# Patient Record
Sex: Female | Born: 1980 | Race: White | Hispanic: No | Marital: Single | Smoking: Current every day smoker
Health system: Southern US, Community
[De-identification: ages and names within clinical notes are randomized; demographics above are authoritative.]

## PROBLEM LIST (undated history)

## (undated) DIAGNOSIS — J45909 Unspecified asthma, uncomplicated: Secondary | ICD-10-CM

## (undated) DIAGNOSIS — Z889 Allergy status to unspecified drugs, medicaments and biological substances status: Secondary | ICD-10-CM

## (undated) HISTORY — DX: Allergy status to unspecified drugs, medicaments and biological substances: Z88.9

## (undated) HISTORY — DX: Unspecified asthma, uncomplicated: J45.909

---

## 1998-06-01 ENCOUNTER — Emergency Department (HOSPITAL_COMMUNITY): Admission: EM | Admit: 1998-06-01 | Discharge: 1998-06-01 | Payer: Self-pay | Admitting: Emergency Medicine

## 1999-01-04 ENCOUNTER — Encounter: Payer: Self-pay | Admitting: Emergency Medicine

## 1999-01-04 ENCOUNTER — Emergency Department (HOSPITAL_COMMUNITY): Admission: EM | Admit: 1999-01-04 | Discharge: 1999-01-04 | Payer: Self-pay | Admitting: Emergency Medicine

## 2008-07-22 ENCOUNTER — Ambulatory Visit: Payer: Self-pay | Admitting: Family Medicine

## 2008-10-23 ENCOUNTER — Ambulatory Visit: Payer: Self-pay | Admitting: Family Medicine

## 2010-06-18 ENCOUNTER — Ambulatory Visit: Payer: Self-pay | Admitting: Family Medicine

## 2011-06-22 ENCOUNTER — Emergency Department (HOSPITAL_COMMUNITY)
Admission: EM | Admit: 2011-06-22 | Discharge: 2011-06-23 | Disposition: A | Discharge status: Home / Self Care | Payer: BC Managed Care – PPO | Attending: Emergency Medicine | Admitting: Emergency Medicine

## 2011-06-22 DIAGNOSIS — J45909 Unspecified asthma, uncomplicated: Secondary | ICD-10-CM | POA: Insufficient documentation

## 2011-06-22 DIAGNOSIS — R059 Cough, unspecified: Secondary | ICD-10-CM | POA: Insufficient documentation

## 2011-06-22 DIAGNOSIS — R05 Cough: Secondary | ICD-10-CM | POA: Insufficient documentation

## 2011-06-23 ENCOUNTER — Emergency Department (HOSPITAL_COMMUNITY): Payer: BC Managed Care – PPO

## 2011-07-30 ENCOUNTER — Emergency Department (HOSPITAL_COMMUNITY)
Admission: EM | Admit: 2011-07-30 | Discharge: 2011-07-30 | Disposition: A | Discharge status: Home / Self Care | Payer: BC Managed Care – PPO | Attending: Emergency Medicine | Admitting: Emergency Medicine

## 2011-07-30 DIAGNOSIS — K029 Dental caries, unspecified: Secondary | ICD-10-CM | POA: Insufficient documentation

## 2011-07-30 DIAGNOSIS — K089 Disorder of teeth and supporting structures, unspecified: Secondary | ICD-10-CM | POA: Insufficient documentation

## 2012-04-19 ENCOUNTER — Encounter (HOSPITAL_COMMUNITY): Payer: Self-pay | Admitting: Emergency Medicine

## 2012-04-19 ENCOUNTER — Emergency Department (HOSPITAL_COMMUNITY)
Admission: EM | Admit: 2012-04-19 | Discharge: 2012-04-19 | Disposition: A | Discharge status: Home / Self Care | Payer: BC Managed Care – PPO | Attending: Emergency Medicine | Admitting: Emergency Medicine

## 2012-04-19 ENCOUNTER — Emergency Department (HOSPITAL_COMMUNITY): Payer: BC Managed Care – PPO

## 2012-04-19 DIAGNOSIS — S60211A Contusion of right wrist, initial encounter: Secondary | ICD-10-CM

## 2012-04-19 DIAGNOSIS — F17200 Nicotine dependence, unspecified, uncomplicated: Secondary | ICD-10-CM | POA: Insufficient documentation

## 2012-04-19 DIAGNOSIS — W2209XA Striking against other stationary object, initial encounter: Secondary | ICD-10-CM | POA: Insufficient documentation

## 2012-04-19 DIAGNOSIS — S60219A Contusion of unspecified wrist, initial encounter: Secondary | ICD-10-CM | POA: Insufficient documentation

## 2012-04-19 DIAGNOSIS — Y9269 Other specified industrial and construction area as the place of occurrence of the external cause: Secondary | ICD-10-CM | POA: Insufficient documentation

## 2012-04-19 MED ORDER — HYDROCODONE-ACETAMINOPHEN 5-325 MG PO TABS
1.0000 | ORAL_TABLET | Freq: Once | ORAL | Status: AC
  Administered 2012-04-19: 1 via ORAL
  Filled 2012-04-19: qty 1

## 2012-04-19 MED ORDER — HYDROCODONE-ACETAMINOPHEN 5-325 MG PO TABS: 1.0000 | ORAL_TABLET | ORAL | Status: AC | PRN

## 2012-04-19 NOTE — ED Notes (Signed)
Patient states that she was loading a bin at work when her right inner wrist hit  A hook and hurt her wrist. Small puncture wound to her inner wrist with pain radiating to her thumb and elbow

## 2012-04-19 NOTE — Discharge Instructions (Signed)
You were see and evaluated today for your wrist injury. Your x-rays do show any concerning findings or broken bones. You have been given a brace to help rest your wrist. Use rest, ice, compression and elevation to reduce pain and swelling. Please followup with an orthopedic hand specialist or primary care provider for continued evaluation and treatment.   Contusion A contusion is a deep bruise. Contusions are the result of an injury that caused bleeding under the skin. The contusion may turn blue, purple, or yellow. Minor injuries will give you a painless contusion, but more severe contusions may stay painful and swollen for a few weeks.  CAUSES  A contusion is usually caused by a blow, trauma, or direct force to an area of the body. SYMPTOMS   Swelling and redness of the injured area.   Bruising of the injured area.   Tenderness and soreness of the injured area.   Pain.  DIAGNOSIS  The diagnosis can be made by taking a history and physical exam. An X-ray, CT scan, or MRI may be needed to determine if there were any associated injuries, such as fractures. TREATMENT  Specific treatment will depend on what area of the body was injured. In general, the best treatment for a contusion is resting, icing, elevating, and applying cold compresses to the injured area. Over-the-counter medicines may also be recommended for pain control. Ask your caregiver what the best treatment is for your contusion. HOME CARE INSTRUCTIONS   Put ice on the injured area.   Put ice in a plastic bag.   Place a towel between your skin and the bag.   Leave the ice on for 15 to 20 minutes, 3 to 4 times a day.   Only take over-the-counter or prescription medicines for pain, discomfort, or fever as directed by your caregiver. Your caregiver may recommend avoiding anti-inflammatory medicines (aspirin, ibuprofen, and naproxen) for 48 hours because these medicines may increase bruising.   Rest the injured area.   If  possible, elevate the injured area to reduce swelling.  SEEK IMMEDIATE MEDICAL CARE IF:   You have increased bruising or swelling.   You have pain that is getting worse.   Your swelling or pain is not relieved with medicines.  MAKE SURE YOU:   Understand these instructions.   Will watch your condition.   Will get help right away if you are not doing well or get worse.  Document Released: 07/20/2005 Document Revised: 09/29/2011 Document Reviewed: 08/15/2011 [facility] Patient Information 2012 [place], [place].    RESOURCE GUIDE  Chronic Pain Problems: Contact [facility]  [phone] Patients need to be referred by their primary care doctor.  Insufficient Money for Medicine: Contact United Way:  call "211" or [facility] [phone].  No Primary Care Doctor: - Call Health Connect  [phone] - can help you locate a primary care doctor that  accepts your insurance, provides certain services, etc. - Physician Referral [phone]  Agencies that provide inexpensive medical care: - [facility]  [phone] - [facility]  [phone] - [facility]  [phone] [facility]  [phone] - Planned Parenthood  [phone] [facility]  [phone]  Medicaid-accepting [facility] Providers: - [facility]- [address]  [phone], Mon-Fri 9am-7pm, Sat 9am-1pm - [facility]- [address] [place], [address] [place]  [phone] - [facility]- [address]  [phone] [facility]- [address]  [phone] - Renaye Rakers- [address] [place], [address], [phone]  Only accepts [place] patients after they have their name  applied to their card  Self Pay (no insurance) in [facility]: - Sickle Cell Patients: Dr Willey Blade, [facility]  [address] [place],  [phone] - [facility]- [address] [place]  [phone]       [facility]- [address]       -     [facility]- see information above (Speak to [employer] if you do not have insurance)       -  [facility]- [address] [place], [phone]       -  [facility]- [address],  [phone]       -  [facility]- [address] [phone]       -  Dr Julio Sicks-  [address], [place], [phone]       -  [facility]- [address] [phone]       -  [facility]- [address], [phone], also [address], [phone]       -    [facility]- [address] [place], [phone], 1st & 3rd Saturday   every month, 10am-1pm  1) Find a Doctor and Pay Out of Pocket Although you won't have to find out who is covered by your insurance plan, it is a good idea to ask around and get recommendations. You will then need to call the office and see if the doctor you have chosen will accept you as a new patient and what types of options they offer for patients who are self-pay. Some doctors offer discounts or will set up payment plans for their patients who do not have insurance, but you will need to ask so you aren't surprised when you get to your appointment.  2) Contact Your Local Health Department Not all health departments have doctors that can see patients for sick visits, but many do, so it is worth a call to see if yours does. If you don't know where your local health department is, you can check in your phone book. The CDC also has a tool to help you locate your state's health department, and many state websites also have listings of all of their local health departments.  3) Find a Walk-in Clinic If your illness is not likely to be very severe or complicated, you may want to try a walk in clinic. These are popping up all over the country in pharmacies,  drugstores, and shopping centers. They're usually staffed by nurse practitioners or physician assistants that have been trained to treat common illnesses and complaints. They're usually fairly quick and inexpensive. However, if you have serious medical issues or chronic medical problems, these are probably not your best option  STD Testing - [facility] of [facility] [place], STD Clinic, [address], [place], phone [phone] or [phone].  Monday - Friday, call for an appointment. [facility] Department of [employer], STD Clinic, [address], [place], phone [phone] or [phone].  Monday - Friday, call for an appointment.  Abuse/Neglect: [facility] Child Abuse Hotline (959)372-3084 [facility] Child Abuse  Hotline 906 567 2747 (After Hours)  Emergency Shelter:  [facility] (503)803-4468  Maternity Homes: - [facility] (513)051-7307 - [facility] [phone]  MRSA Hotline #:   (954) 405-0861  [facility] Resources  [facility]  United Way [facility] Dept. [address]                 [address]         [address] [place] [address]  Blondell Reveal Phone:  [phone]                                  Phone:  [phone]                   Phone:  [phone]  [facility], [phone] - [facility] - [phone]       -     [facility] in [place], [address],                                  (719)055-7411, [facility] Child Abuse Hotline (401)270-7245 or 209 778 2828 (After Hours)   Behavioral Health Services  Substance Abuse Resources: - Alcohol and Drug Services  [phone] - [facility]  (513)874-2960 - [facility] [phone] Floydene Flock [phone] - Residential & Outpatient Substance Abuse Program  [phone]  Psychological Services: [facility]  [phone] Services  [phone] - [facility], [phone] [place]. [address], [place], ACCESS LINE: (432)[phone] or [phone], [employer]an.[email or web address]  Dental Assistance  If unable to pay or uninsured, contact:  [facility] or [facility] to become qualified for the adult dental clinic.  Patients with Medicaid: [facility] [phone] [address], [phone] [address], [phone]  If unable to pay, or uninsured, contact [facility] (657)648-5446) or [facility] Department [phone] in [place], [phone] in [facility]) to become qualified for the adult dental clinic  Other Low-Cost Community Dental Services: - [facility]- [address] [place], [place], [place], [address], [phone], [address]. [phone], 2nd and 4th Thursday of the month at 6:30am.  10 clients each day by appointment, can sometimes see walk-in patients if someone does not show for an appointment. [facility]- [address] Ether Griffins [place], [place], [address], [phone] - [facility]- [address], [place], [place], [address], [phone] - [facility]- [phone] [facility]- [phone] [facility] Department- [phone]

## 2012-04-19 NOTE — ED Provider Notes (Signed)
Medical screening examination/treatment/procedure(s) were performed by non-physician practitioner and as supervising physician I was immediately available for consultation/collaboration.   Desjuan Stearns L Genesis Novosad, MD 04/19/12 2250 

## 2012-04-19 NOTE — ED Provider Notes (Signed)
History     CSN: [id number]  Arrival date & time 04/19/12  0223   First MD Initiated Contact with Patient 04/19/12 [phone]      Chief Complaint  Patient presents with  . Wrist Injury   HPI  History provided by the patient. Patient is a 31 year old female with no significant past medical history who presents with complaints of right wrist pain and injury. 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 She complains of pain and swelling to the area. Pain is worse with movements especially flexion. Patient also has some associated tingling and numbness to the hand and fingers greatest over the right thumb. She denies any weakness. Patient did not do anything to treat her symptoms. She denies any other aggravating or alleviating factors. She denies any other associated symptoms.    History reviewed. No pertinent past medical history.  History reviewed. No pertinent past surgical history.  History reviewed. No pertinent family history.  History  Substance Use Topics  . Smoking status: Current Everyday Smoker -- 1.0 packs/day    Types: Cigarettes  . Smokeless tobacco: Not on file  . Alcohol Use: No    OB History    Grav Para Term Preterm Abortions TAB SAB Ect Mult Living                  Review of Systems  Musculoskeletal: Positive for joint swelling.       Wrist pain  Neurological: Positive for numbness. Negative for weakness.    Allergies  Review of patient's allergies indicates no known allergies.  Home Medications  No current outpatient prescriptions on file.  BP 107/64  Pulse 76  Temp 98 F (36.7 C) (Oral)  Resp 18  Ht 5\' 4"  (1.626 m)  Wt 132 lb (59.875 kg)  BMI 22.66 kg/m2  SpO2 99%  LMP 04/19/2012  Physical Exam  Nursing note and vitals reviewed. Constitutional: She is oriented to person, place, and time. She appears well-developed and  well-nourished. No distress.  HENT:  Head: Normocephalic.  Cardiovascular: Normal rate and regular rhythm.   Pulmonary/Chest: Effort normal and breath sounds normal.  Musculoskeletal:       Small 2 cm area of localized erythema to medial anterior right wrist with small abrasion. There is mild associated swelling. Tenderness to palpation over the area. Pain with flexion of wrists and digits and hand. Normal strength in fingers with flexion and extension. Normal distal sensations and cap refill less than 2 seconds. Normal radial pulse at wrist. No significant pains over the bony structures.  Neurological: She is alert and oriented to person, place, and time.  Skin: Skin is warm and dry.  Psychiatric: She has a normal mood and affect. Her behavior is normal.    ED Course  Procedures   Dg Wrist Complete Right  04/19/2012  *RADIOLOGY REPORT*  Clinical Data: Wrist injury.  Difficulty grasping things.  RIGHT WRIST - COMPLETE 3+ VIEW  Comparison: None.  Findings: Slight dorsal orientation of the distal ulna which is likely positional.  Right wrist otherwise appears intact.  No evidence of acute fracture or subluxation.  No focal bone lesion or bone destruction.  Bone cortex and trabecular architecture appear intact.  No radiopaque soft tissue foreign bodies.  IMPRESSION: No acute bony abnormalities.  Original Report Authenticated By: Marlon Pel, M.D.     1. Contusion of wrist,  right       MDM  Patient seen and evaluated. Patient no acute distress.        Angus Seller, [place] 04/19/12 [phone]

## 2012-04-19 NOTE — ED Notes (Signed)
Pt states she is not wanting to report injury as work related.

## 2012-09-15 ENCOUNTER — Emergency Department (HOSPITAL_COMMUNITY)
Admission: EM | Admit: 2012-09-15 | Discharge: 2012-09-15 | Disposition: A | Discharge status: Home / Self Care | Payer: BC Managed Care – PPO | Attending: Emergency Medicine | Admitting: Emergency Medicine

## 2012-09-15 ENCOUNTER — Encounter (HOSPITAL_COMMUNITY): Payer: Self-pay | Admitting: *Deleted

## 2012-09-15 DIAGNOSIS — R221 Localized swelling, mass and lump, neck: Secondary | ICD-10-CM | POA: Insufficient documentation

## 2012-09-15 DIAGNOSIS — K0889 Other specified disorders of teeth and supporting structures: Secondary | ICD-10-CM

## 2012-09-15 DIAGNOSIS — R220 Localized swelling, mass and lump, head: Secondary | ICD-10-CM | POA: Insufficient documentation

## 2012-09-15 DIAGNOSIS — K089 Disorder of teeth and supporting structures, unspecified: Secondary | ICD-10-CM | POA: Insufficient documentation

## 2012-09-15 DIAGNOSIS — F17200 Nicotine dependence, unspecified, uncomplicated: Secondary | ICD-10-CM | POA: Insufficient documentation

## 2012-09-15 MED ORDER — PENICILLIN V POTASSIUM 500 MG PO TABS
500.0000 mg | ORAL_TABLET | Freq: Once | ORAL | Status: AC
  Administered 2012-09-15: 500 mg via ORAL
  Filled 2012-09-15: qty 1

## 2012-09-15 MED ORDER — HYDROCODONE-ACETAMINOPHEN 5-325 MG PO TABS: 1.0000 | ORAL_TABLET | Freq: Four times a day (QID) | ORAL | Status: DC | PRN

## 2012-09-15 MED ORDER — OXYCODONE-ACETAMINOPHEN 5-325 MG PO TABS
1.0000 | ORAL_TABLET | Freq: Once | ORAL | Status: AC
Start: 2012-09-15 — End: 2012-09-15
  Administered 2012-09-15: 1 via ORAL
  Filled 2012-09-15: qty 1

## 2012-09-15 MED ORDER — IBUPROFEN 800 MG PO TABS
800.0000 mg | ORAL_TABLET | Freq: Once | ORAL | Status: AC
  Administered 2012-09-15: 800 mg via ORAL
  Filled 2012-09-15: qty 1

## 2012-09-15 MED ORDER — PENICILLIN V POTASSIUM 500 MG PO TABS: 500.0000 mg | ORAL_TABLET | Freq: Four times a day (QID) | ORAL | Status: DC

## 2012-09-15 NOTE — ED Notes (Signed)
Patient given discharge instructions, information, prescriptions, and diet order. Patient states that they adequately understand discharge information given and to return to ED if symptoms return or worsen.     

## 2012-09-15 NOTE — ED Notes (Signed)
Pt has pain and swelling on left side of face and tooth pain on upper left side

## 2012-09-15 NOTE — ED Provider Notes (Signed)
History     CSN: [id number]  Arrival date & time 09/15/12  2131   First MD Initiated Contact with Patient 09/15/12 2215      Chief Complaint  Patient presents with  . Facial Swelling  . Dental Pain    (Consider location/radiation/quality/duration/timing/severity/associated sxs/prior treatment) HPI Patient presents to the emergency department with left lower dental pain.  Patient, states that this began 2 days ago, and has gotten worse since that time.  Patient denies nausea, vomiting, fever, or difficulty breathing, difficulty swallowing, neck pain, neck swelling, or headache.  Patient did not take anything prior to arrival, for her discomfort, other than Tylenol without relief. No past medical history on file.  No past surgical history on file.  History reviewed. No pertinent family history.  History  Substance Use Topics  . Smoking status: Current Every Day Smoker -- 1.0 packs/day    Types: Cigarettes  . Smokeless tobacco: Not on file  . Alcohol Use: No    OB History    Grav Para Term Preterm Abortions TAB SAB Ect Mult Living                  Review of Systems All other systems negative except as documented in the HPI. All pertinent positives and negatives as reviewed in the HPI.  Allergies  Review of patient's allergies indicates no known allergies.  Home Medications   Current Outpatient Rx  Name  Route  Sig  Dispense  Refill  . ACETAMINOPHEN 500 MG PO TABS   Oral   Take 1,000 mg by mouth every 6 (six) hours as needed. Pain           BP 123/73  Pulse 75  Temp 98.8 F (37.1 C) (Oral)  Resp 18  SpO2 100%  LMP 08/20/2012  Physical Exam  Nursing note and vitals reviewed. Constitutional: She appears well-developed and well-nourished.  HENT:  Head: Normocephalic and atraumatic.  Mouth/Throat: Uvula is midline and oropharynx is clear and moist. Abnormal dentition. Dental caries present. No uvula swelling.    Neck: Normal range of motion. Neck  supple.  Cardiovascular: Normal rate and regular rhythm.   Pulmonary/Chest: Effort normal and breath sounds normal.  Skin: Skin is warm and dry. No rash noted.    ED Course  Procedures (including critical care time)  Patient be referred to dentistry.  She is advised return if any worsening in her condition   MDM         Carlyle Dolly, PA-C 09/15/12 2317

## 2012-09-16 NOTE — ED Provider Notes (Signed)
Medical screening examination/treatment/procedure(s) were performed by non-physician practitioner and as supervising physician I was immediately available for consultation/collaboration.  Donnetta Hutching, MD 09/16/12 [phone]

## 2014-02-25 ENCOUNTER — Ambulatory Visit (INDEPENDENT_AMBULATORY_CARE_PROVIDER_SITE_OTHER): Payer: BC Managed Care – PPO | Admitting: Family Medicine

## 2014-02-25 VITALS — BP 108/62 | HR 84 | Temp 98.0°F | Resp 18 | Ht 64.0 in | Wt 160.0 lb

## 2014-02-25 DIAGNOSIS — R059 Cough, unspecified: Secondary | ICD-10-CM

## 2014-02-25 DIAGNOSIS — F17200 Nicotine dependence, unspecified, uncomplicated: Secondary | ICD-10-CM

## 2014-02-25 DIAGNOSIS — J9801 Acute bronchospasm: Secondary | ICD-10-CM

## 2014-02-25 DIAGNOSIS — J209 Acute bronchitis, unspecified: Secondary | ICD-10-CM

## 2014-02-25 DIAGNOSIS — Z720 Tobacco use: Secondary | ICD-10-CM

## 2014-02-25 DIAGNOSIS — R05 Cough: Secondary | ICD-10-CM

## 2014-02-25 DIAGNOSIS — J00 Acute nasopharyngitis [common cold]: Secondary | ICD-10-CM

## 2014-02-25 MED ORDER — HYDROCODONE-HOMATROPINE 5-1.5 MG/5ML PO SYRP: 5.0000 mL | ORAL_SOLUTION | Freq: Three times a day (TID) | ORAL | Status: DC | PRN

## 2014-02-25 MED ORDER — CEFDINIR 300 MG PO CAPS: 300.0000 mg | ORAL_CAPSULE | Freq: Two times a day (BID) | ORAL | Status: DC

## 2014-02-25 MED ORDER — PREDNISONE 20 MG PO TABS: ORAL_TABLET | ORAL | Status: DC

## 2014-02-25 MED ORDER — ALBUTEROL SULFATE HFA 108 (90 BASE) MCG/ACT IN AERS: 2.0000 | INHALATION_SPRAY | Freq: Four times a day (QID) | RESPIRATORY_TRACT | Status: DC | PRN

## 2014-02-25 NOTE — Patient Instructions (Signed)
Use the inhaler as needed, and the cough syrup as needed- remember this will make you sleepy so do not take it and drive.  Use the omnicef (antibiotic) and prednisone (steroid) as directed.  Let me know if you do not feel better soon- and good luck with quitting smoking!!

## 2014-02-25 NOTE — Progress Notes (Signed)
[facility] [address], [place] [address] [phone]  Date:  02/25/2014   Name:  Donna Morton   DOB:  [birth year]   MRN:  [id number]  PCP:  Carollee HerterLALONDE,JOHN CHARLES, MD    Chief Complaint: Cough and Nasal Congestion   History of Present Illness:  Donna Morton is a 33 y.o. very pleasant female patient who presents with the following:  She is here today with illness.  She notes a severe cough, runny nose, nasal and sinus congestion.  The sx are now in her chest and she is coughing up some mucus.  She is having a hard time sleeping.   She has been ill for about 5 days.   She has not noted a fever, no GI sx except she had a loose stool after drinking coffee today.   She has noted some body aches yesterday at work- she works at [employer] and did a long shift yesterday.   She does have asthma and has noted wheezing, but she did not have an inhaler available at home   LMP about 3 weeks ago.    She is a smoker- has cut down from 2 packs to 1, and is trying to quit totally   There are no active problems to display for this patient.   Past Medical History  Diagnosis Date  . Asthma   . H/O seasonal allergies     History reviewed. No pertinent past surgical history.  History  Substance Use Topics  . Smoking status: Current Every Day Smoker -- 1.00 packs/day    Types: Cigarettes  . Smokeless tobacco: Not on file  . Alcohol Use: No    Family History  Problem Relation Age of Onset  . Adopted: Yes    No Known Allergies  Medication list has been reviewed and updated.  Current Outpatient Prescriptions on File Prior to Visit  Medication Sig Dispense Refill  . acetaminophen (TYLENOL) 500 MG tablet Take 1,000 mg by mouth every 6 (six) hours as needed. Pain      . HYDROcodone-acetaminophen (NORCO/VICODIN) 5-325 MG per tablet Take 1 tablet by mouth every 6 (six) hours as needed for pain.  15 tablet  0  . penicillin v potassium (VEETID) 500 MG tablet Take 1  tablet (500 mg total) by mouth 4 (four) times daily.  28 tablet  0   No current facility-administered medications on file prior to visit.    Review of Systems:  As per HPI- otherwise negative.   Physical Examination: Filed Vitals:   02/25/14 1904  BP: 108/62  Pulse: 84  Temp: 98 F (36.7 C)  Resp: 18   Filed Vitals:   02/25/14 1904  Height: 5\' 4"  (1.626 m)  Weight: 160 lb (72.576 kg)   Body mass index is 27.45 kg/(m^2). Ideal Body Weight: Weight in (lb) to have BMI = 25: 145.3  GEN: WDWN, NAD, Non-toxic, A & O x 3, coughing a good bit in room.  Chronic smoker HEENT: Atraumatic, Normocephalic. Neck supple. No masses, No LAD.  Bilateral TM wnl, oropharynx normal.  PEERL,EOMI.   Ears and Nose: No external deformity. CV: RRR, No M/G/R. No JVD. No thrill. No extra heart sounds. PULM: CTA B, no wheezes, crackles, rhonchi. No retractions. No resp. distress. No accessory muscle use. EXTR: No c/c/e NEURO Normal gait.  PSYCH: Normally interactive. Conversant. Not depressed or anxious appearing.  Calm demeanor.    Assessment and Plan: Cough - Plan: albuterol (PROVENTIL HFA;VENTOLIN HFA)  108 (90 BASE) MCG/ACT inhaler, HYDROcodone-homatropine (HYCODAN) 5-1.5 MG/5ML syrup  Bronchospasm - Plan: albuterol (PROVENTIL HFA;VENTOLIN HFA) 108 (90 BASE) MCG/ACT inhaler  Acute bronchitis - Plan: predniSONE (DELTASONE) 20 MG tablet, cefdinir (OMNICEF) 300 MG capsule  Tobacco abuse  Treat for asthmatic bronchitis.  Encouraged her to quit smoking and she plans to try.  She will let us know if not better soon  Signed Abbe AmsterdamJessica Terrian Sentell, MD

## 2015-12-28 ENCOUNTER — Telehealth: Payer: Self-pay | Admitting: Family Medicine

## 2015-12-28 NOTE — Telephone Encounter (Signed)
Pt called and states you know her and her mom Toniann FailWendy.  She hasn't been here in a while and wants to know if you will taker her back as a new patient.  She was sent home from [employer] on disability as she had a breakdown at work.  She has been diagnosed with Bipolar and needs your help with STD forms and wants therapy.  Please advise pt [phone]

## 2015-12-29 ENCOUNTER — Telehealth: Payer: Self-pay | Admitting: Family Medicine

## 2015-12-29 NOTE — Telephone Encounter (Signed)
I will be happy to take care of her medical needs but she will need to see a psychologist/psychiatrist to fill the paperwork out. Dr. Nolen MuMcKinney

## 2015-12-29 NOTE — Telephone Encounter (Signed)
Pt called back.  I advised her Dr. Susann GivensLalonde stated whomever gave her the bipolar dx needs to [facility], but we could see her for primary care.  She understood

## 2015-12-29 NOTE — Telephone Encounter (Signed)
Called pt reached voice mail. Advised whomever gave her dx to have them complete the paper work.  That we could take care of her primary care needs.

## 2016-01-26 ENCOUNTER — Encounter: Payer: Self-pay | Admitting: Family Medicine

## 2016-01-26 ENCOUNTER — Ambulatory Visit (INDEPENDENT_AMBULATORY_CARE_PROVIDER_SITE_OTHER): Payer: BLUE CROSS/BLUE SHIELD | Admitting: Family Medicine

## 2016-01-26 VITALS — BP 122/72 | HR 64 | Ht 65.25 in | Wt 165.2 lb

## 2016-01-26 DIAGNOSIS — Z720 Tobacco use: Secondary | ICD-10-CM

## 2016-01-26 DIAGNOSIS — R059 Cough, unspecified: Secondary | ICD-10-CM

## 2016-01-26 DIAGNOSIS — Z0000 Encounter for general adult medical examination without abnormal findings: Secondary | ICD-10-CM | POA: Diagnosis not present

## 2016-01-26 DIAGNOSIS — J301 Allergic rhinitis due to pollen: Secondary | ICD-10-CM | POA: Insufficient documentation

## 2016-01-26 DIAGNOSIS — F319 Bipolar disorder, unspecified: Secondary | ICD-10-CM | POA: Diagnosis not present

## 2016-01-26 DIAGNOSIS — R05 Cough: Secondary | ICD-10-CM | POA: Diagnosis not present

## 2016-01-26 LAB — POCT URINALYSIS DIPSTICK
Bilirubin, UA: NEGATIVE
Blood, UA: NEGATIVE
Glucose, UA: NEGATIVE
KETONES UA: NEGATIVE
LEUKOCYTES UA: NEGATIVE
NITRITE UA: NEGATIVE
PROTEIN UA: NEGATIVE
Spec Grav, UA: 1.015
Urobilinogen, UA: NEGATIVE
pH, UA: 6.5

## 2016-01-26 MED ORDER — ALBUTEROL SULFATE HFA 108 (90 BASE) MCG/ACT IN AERS: 2.0000 | INHALATION_SPRAY | Freq: Four times a day (QID) | RESPIRATORY_TRACT | Status: AC | PRN

## 2016-01-26 NOTE — Progress Notes (Signed)
Subjective:    Patient ID: Donna PollenCamilla M Morton, female    DOB: [birth year], 35 y.o.   MRN: [id number]  HPI She is here for complete examination. She has not been seen here in several years. I was originally her doctor since age 89. She has been diagnosed with bipolar disorder and is now on Depakote. This was started approximately 6 days ago through the mental health department. Since then she has had difficulty with diarrhea, increased heart rate, Abdominal pain and dizziness. This did get slightly better but then reoccurred today. She has concerns over this being related to the Depakote. She apparently is in the process of having a Depakote level redrawn. She states that the Depakote has made her a little bit more mellow but she did have one episode of extreme anger and rage. She also states that her boss apparently took her out of work on a short-term disability and apparently was surgically given FMLA form filled out. She does have an underlying history of asthma/exercise-induced asthma but uses an inhaler roughly once per week. She does smoke and at the spine is not interested in quitting. She does have underlying allergies but does not treat them. She does have 2 children one of which is living with her mother. She recently married one month ago to another woman. Family and social history as well as health maintenance and immunizations were reviewed. She has no other concerns or complaints. She is not interested in having a Pap or pelvic done.   Review of Systems  All other systems reviewed and are negative.      Objective:   Physical Exam BP 122/72 mmHg  Pulse 64  Ht 5' 5.25" (1.657 m)  Wt 165 lb 3.2 oz (74.934 kg)  BMI 27.29 kg/m2  LMP 01/11/2016  General Appearance:    Alert, cooperative, no distress, appears stated age  Head:    Normocephalic, without obvious abnormality, atraumatic  Eyes:    PERRL, conjunctiva/corneas clear, EOM's intact, fundi    benign  Ears:    Normal TM's and  external ear canals  Nose:   Nares normal, mucosa normal, no drainage or sinus   tenderness  Throat:   Lips, mucosa, and tongue normal; teeth and gums normal  Neck:   Supple, no lymphadenopathy;  thyroid:  no   enlargement/tenderness/nodules; no carotid   bruit or JVD  Back:    Spine nontender, no curvature, ROM normal, no CVA     tenderness  Lungs:     Clear to auscultation bilaterally without wheezes, rales or     ronchi; respirations unlabored  Chest Wall:    No tenderness or deformity   Heart:    Regular rate and rhythm, S1 and S2 normal, no murmur, rub   or gallop     Abdomen:     Soft, non-tender, nondistended, normoactive bowel sounds,    no masses, no hepatosplenomegaly     Rectal:    Not performed due to age<40 and no related complaints  Extremities:   No clubbing, cyanosis or edema  Pulses:   2+ and symmetric all extremities  Skin:   Skin color, texture, turgor normal, no rashes or lesions  Lymph nodes:   Cervical, supraclavicular, and axillary nodes normal  Neurologic:   CNII-XII intact, normal strength, sensation and gait; reflexes 2+ and symmetric throughout          Psych:   Normal mood, affect, hygiene and grooming.  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 Recommend she return here when she is ready. Also encouraged her to discuss FMLA with her psychiatrist to get this taken care of. Explained that it would be appropriate to have it done under that situation rather than through me. I will renew her Proventil. Recommend returning here on an as-needed basis or in one year.

## 2016-01-28 ENCOUNTER — Telehealth: Payer: Self-pay | Admitting: Family Medicine

## 2016-01-28 NOTE — Telephone Encounter (Signed)
Pt called answering service stating that we need to call [phone] to get lab results. Called pt to get more details and she stated that Dr Susann GivensLalonde asked her to have recent labs sent to our office and pt wanted us to call [facility] at [phone] to request those labs. Asked pt if she signed auth for us to get that info and she does not think so but she will check with them and if not pt wants to go pick up lab results so that it will not take long to get info to us

## 2016-09-26 ENCOUNTER — Encounter (HOSPITAL_COMMUNITY): Payer: Self-pay | Admitting: Nurse Practitioner

## 2016-09-26 ENCOUNTER — Emergency Department (HOSPITAL_COMMUNITY): Payer: Managed Care, Other (non HMO)

## 2016-09-26 ENCOUNTER — Emergency Department (HOSPITAL_COMMUNITY)
Admission: EM | Admit: 2016-09-26 | Discharge: 2016-09-26 | Disposition: A | Discharge status: Home / Self Care | Payer: Managed Care, Other (non HMO) | Attending: Emergency Medicine | Admitting: Emergency Medicine

## 2016-09-26 DIAGNOSIS — Y929 Unspecified place or not applicable: Secondary | ICD-10-CM | POA: Insufficient documentation

## 2016-09-26 DIAGNOSIS — Y939 Activity, unspecified: Secondary | ICD-10-CM | POA: Diagnosis not present

## 2016-09-26 DIAGNOSIS — F17210 Nicotine dependence, cigarettes, uncomplicated: Secondary | ICD-10-CM | POA: Diagnosis not present

## 2016-09-26 DIAGNOSIS — Y999 Unspecified external cause status: Secondary | ICD-10-CM | POA: Diagnosis not present

## 2016-09-26 DIAGNOSIS — J45909 Unspecified asthma, uncomplicated: Secondary | ICD-10-CM | POA: Insufficient documentation

## 2016-09-26 DIAGNOSIS — W07XXXA Fall from chair, initial encounter: Secondary | ICD-10-CM | POA: Insufficient documentation

## 2016-09-26 DIAGNOSIS — S93402A Sprain of unspecified ligament of left ankle, initial encounter: Secondary | ICD-10-CM | POA: Diagnosis not present

## 2016-09-26 DIAGNOSIS — Z79899 Other long term (current) drug therapy: Secondary | ICD-10-CM | POA: Diagnosis not present

## 2016-09-26 DIAGNOSIS — S99912A Unspecified injury of left ankle, initial encounter: Secondary | ICD-10-CM | POA: Diagnosis present

## 2016-09-26 MED ORDER — NAPROXEN 375 MG PO TABS: 375.0000 mg | ORAL_TABLET | Freq: Two times a day (BID) | ORAL | 0 refills | Status: DC

## 2016-09-26 MED ORDER — NAPROXEN 500 MG PO TABS
500.0000 mg | ORAL_TABLET | Freq: Once | ORAL | Status: AC
  Administered 2016-09-26: 500 mg via ORAL
  Filled 2016-09-26: qty 1

## 2016-09-26 NOTE — ED Provider Notes (Signed)
[facility]-EMERGENCY DEPT Provider Note   CSN: [id number] Arrival date & time: 09/26/16  0250 By signing my name below, I, Bridgette Habermann, attest that this documentation has been prepared under the direction and in the presence of Jevaughn Degollado, MD. Electronically Signed: Bridgette Habermann, ED Scribe. 09/26/16. 3:37 AM.  History   Chief Complaint Chief Complaint  Patient presents with  . Ankle Pain   HPI Comments: Donna Morton is a 35 y.o. female with h/o asthma who presents to the Emergency Department complaining of aching, shooting, 7/10 left ankle pain s/p mechanical injury that occurred last night. Pt states she was putting up Christmas lights when she fell off the stool she was standing on resulting in her twisting her ankle the wrong way. She denies LOC or head injury. Pain is exacerbated with bearing weight and ambulating. Pt has taken Tylenol extra strength around 9 pm last night with minimal relief. Pt denies any additional injuries. She further denies fever, chills, or any other associated symptoms.  The history is provided by the patient. No language interpreter was used.  Ankle Pain   The incident occurred 3 to 5 hours ago. The incident occurred at home. The injury mechanism was a fall. The pain is present in the left ankle. The quality of the pain is described as aching, sharp and throbbing. The pain is at a severity of 7/10. The pain is moderate. The pain has been constant since onset. Associated symptoms include inability to bear weight. She reports no foreign bodies present. The symptoms are aggravated by bearing weight, activity and palpation. She has tried acetaminophen for the symptoms. The treatment provided mild relief.    Past Medical History:  Diagnosis Date  . Asthma   . H/O seasonal allergies     Patient Active Problem List   Diagnosis Date Noted  . Allergic rhinitis due to pollen 01/26/2016  . Tobacco abuse 01/26/2016  . Routine general medical examination at a health care  facility 01/26/2016  . Bipolar I disorder (HCC) 01/26/2016    History reviewed. No pertinent surgical history.  OB History    No data available       Home Medications    Prior to Admission medications   Medication Sig Start Date End Date Taking? Authorizing Provider  albuterol (PROVENTIL HFA;VENTOLIN HFA) 108 (90 Base) MCG/ACT inhaler Inhale 2 puffs into the lungs every 6 (six) hours as needed for wheezing or shortness of breath. 01/26/16   Ronnald Nian, MD  divalproex (DEPAKOTE) 500 MG DR tablet Take 500 mg by mouth 2 (two) times daily.    Historical Provider, MD    Family History Family History  Problem Relation Age of Onset  . Adopted: Yes    Social History Social History  Substance Use Topics  . Smoking status: Current Every Day Smoker    Packs/day: 1.00    Types: Cigarettes  . Smokeless tobacco: Never Used  . Alcohol use No     Allergies   Patient has no known allergies.   Review of Systems Review of Systems  Constitutional: Negative for chills and fever.  Musculoskeletal: Positive for myalgias.  All other systems reviewed and are negative.  Physical Exam Updated Vital Signs BP 108/76 (BP Location: Right Arm)   Pulse 87   Temp 98.2 F (36.8 C) (Oral)   Resp 20   Ht 5\' 4"  (1.626 m)   Wt 190 lb (86.2 kg)   SpO2 100%   BMI 32.61 kg/m   Physical Exam  Constitutional: She appears well-developed and well-nourished.  HENT:  Head: Normocephalic.  Mouth/Throat: Oropharynx is clear and moist. No oropharyngeal exudate.  Eyes: Conjunctivae and EOM are normal. Pupils are equal, round, and reactive to light. Right eye exhibits no discharge. Left eye exhibits no discharge. No scleral icterus.  Neck: Normal range of motion. Neck supple. No JVD present. No tracheal deviation present.  Trachea is midline. No stridor or carotid bruits.   Cardiovascular: Normal rate, regular rhythm, normal heart sounds and intact distal pulses.   No murmur  heard. Pulmonary/Chest: Effort normal and breath sounds normal. No stridor. No respiratory distress. She has no wheezes. She has no rales.  Lungs CTA bilaterally.  Abdominal: Soft. Bowel sounds are normal. She exhibits no distension. There is no tenderness. There is no rebound and no guarding.  Musculoskeletal: Normal range of motion. She exhibits no edema or deformity.       Left ankle: She exhibits normal range of motion, no swelling, no ecchymosis, no deformity, no laceration and normal pulse. No medial malleolus, no head of 5th metatarsal and no proximal fibula tenderness found. Achilles tendon normal.       Left foot: Normal.  All compartments are soft. No palpable cords. Intact distal pulses. Cap refill < 2 seconds to all toes. Achilles' tendon intact. Pelvis stable  Lymphadenopathy:    She has no cervical adenopathy.  Neurological: She is alert. She has normal reflexes. She displays normal reflexes. She exhibits normal muscle tone.  Skin: Skin is warm and dry. Capillary refill takes less than 2 seconds.  Psychiatric: She has a normal mood and affect. Her behavior is normal.  Nursing note and vitals reviewed.    ED Treatments / Results  DIAGNOSTIC STUDIES: Oxygen Saturation is 100% on RA, normal by my interpretation.    COORDINATION OF CARE: 3:35 AM Discussed treatment plan with pt at bedside which includes x-ray and pt agreed to plan.  Results for orders placed or performed in visit on 01/26/16  POCT urinalysis dipstick  Result Value Ref Range   Color, UA yellow    Clarity, UA clear    Glucose, UA n    Bilirubin, UA n    Ketones, UA n    Spec Grav, UA 1.015    Blood, UA n    pH, UA 6.5    Protein, UA n    Urobilinogen, UA negative    Nitrite, UA n    Leukocytes, UA Negative Negative   Dg Ankle Complete Left  Result Date: 09/26/2016 CLINICAL DATA:  Larey SeatFell from stool hanging Christmas lights. Diffuse ankle pain. EXAM: LEFT FOOT - COMPLETE 3+ VIEW; LEFT ANKLE COMPLETE - 3+  VIEW COMPARISON:  None. FINDINGS: No fracture deformity nor dislocation. The ankle mortise appears congruent and the tibiofibular syndesmosis intact. No destructive bony lesions. Small plantar calcaneal spur. Soft tissue planes are non-suspicious. IMPRESSION: Negative. Electronically Signed   By: Awilda Metroourtnay  Bloomer M.D.   On: 09/26/2016 04:04   Dg Foot Complete Left  Result Date: 09/26/2016 CLINICAL DATA:  Larey SeatFell from stool hanging Christmas lights. Diffuse ankle pain. EXAM: LEFT FOOT - COMPLETE 3+ VIEW; LEFT ANKLE COMPLETE - 3+ VIEW COMPARISON:  None. FINDINGS: No fracture deformity nor dislocation. The ankle mortise appears congruent and the tibiofibular syndesmosis intact. No destructive bony lesions. Small plantar calcaneal spur. Soft tissue planes are non-suspicious. IMPRESSION: Negative. Electronically Signed   By: Awilda Metroourtnay  Bloomer M.D.   On: 09/26/2016 04:04    Radiology No results found.  Procedures Procedures (  including critical care time)  Medications Ordered in ED  Medications  naproxen (NAPROSYN) tablet 500 mg (500 mg Oral Given 09/26/16 0348)    Final Clinical Impressions(s) / ED Diagnoses  Ankle sprain: RICE therapy.  ASO applied.   All questions answered to patient's satisfaction. Based on history and exam patient has been appropriately medically screened and emergency conditions excluded. Patient is stable for discharge at this time. Follow up with your PMD for recheck in 2 days and strict return precautions given.  New Prescriptions New Prescriptions   No medications on file   I personally performed the services described in this documentation, which was scribed in my presence. The recorded information has been reviewed and is accurate.      Cy BlamerApril Massiah Minjares, MD 09/26/16 [phone]

## 2016-09-26 NOTE — ED Notes (Signed)
Pt reports that he was putting up christmas lights on 09/25/16 and fell off of stool and hurt left ankle. Swelling and pain noted to left lateral ankle.

## 2016-09-26 NOTE — ED Triage Notes (Signed)
Patient states she was putting up Christmas lights and fell off of the stool and sprung ankle. Was trying to elevate and take Tylenol to see if pain would ease up but has worsened throughout the night. Last Extra Strength Tylenol taken at 12/3  @ 9pm. States pain is a constant ache that shoots and worsens when she bears weight. Denies hitting head or any other area of body, did not lose consciousness.

## 2019-04-05 ENCOUNTER — Emergency Department (HOSPITAL_COMMUNITY)
Admission: EM | Admit: 2019-04-05 | Discharge: 2019-04-05 | Disposition: A | Discharge status: Home / Self Care | Payer: Medicaid Other | Attending: Emergency Medicine | Admitting: Emergency Medicine

## 2019-04-05 ENCOUNTER — Encounter (HOSPITAL_COMMUNITY): Payer: Self-pay | Admitting: Emergency Medicine

## 2019-04-05 ENCOUNTER — Other Ambulatory Visit: Payer: Self-pay

## 2019-04-05 DIAGNOSIS — Z79899 Other long term (current) drug therapy: Secondary | ICD-10-CM | POA: Diagnosis not present

## 2019-04-05 DIAGNOSIS — F17210 Nicotine dependence, cigarettes, uncomplicated: Secondary | ICD-10-CM | POA: Insufficient documentation

## 2019-04-05 DIAGNOSIS — L551 Sunburn of second degree: Secondary | ICD-10-CM

## 2019-04-05 DIAGNOSIS — J45909 Unspecified asthma, uncomplicated: Secondary | ICD-10-CM | POA: Diagnosis not present

## 2019-04-05 DIAGNOSIS — L559 Sunburn, unspecified: Secondary | ICD-10-CM | POA: Diagnosis present

## 2019-04-05 DIAGNOSIS — L550 Sunburn of first degree: Secondary | ICD-10-CM

## 2019-04-05 MED ORDER — OXYCODONE-ACETAMINOPHEN 5-325 MG PO TABS
1.0000 | ORAL_TABLET | Freq: Once | ORAL | Status: AC
  Administered 2019-04-05: 1 via ORAL
  Filled 2019-04-05: qty 1

## 2019-04-05 MED ORDER — HYDROCODONE-ACETAMINOPHEN 5-325 MG PO TABS: 2.0000 | ORAL_TABLET | Freq: Four times a day (QID) | ORAL | 0 refills | Status: AC | PRN
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
## 2019-04-05 NOTE — Discharge Instructions (Signed)
Keep taking ibuprofen 600 mg every 6 hours as needed for pain. Vicodin for break through pain.

## 2019-06-05 ENCOUNTER — Other Ambulatory Visit: Payer: Self-pay

## 2019-06-05 ENCOUNTER — Encounter (HOSPITAL_COMMUNITY): Payer: Self-pay | Admitting: Emergency Medicine

## 2019-06-05 ENCOUNTER — Emergency Department (HOSPITAL_COMMUNITY)
Admission: EM | Admit: 2019-06-05 | Discharge: 2019-06-05 | Disposition: A | Discharge status: Home / Self Care | Payer: Medicaid Other | Attending: Emergency Medicine | Admitting: Emergency Medicine

## 2019-06-05 ENCOUNTER — Emergency Department (HOSPITAL_COMMUNITY): Payer: Medicaid Other

## 2019-06-05 DIAGNOSIS — J45909 Unspecified asthma, uncomplicated: Secondary | ICD-10-CM | POA: Insufficient documentation

## 2019-06-05 DIAGNOSIS — F17210 Nicotine dependence, cigarettes, uncomplicated: Secondary | ICD-10-CM | POA: Insufficient documentation

## 2019-06-05 DIAGNOSIS — Z79899 Other long term (current) drug therapy: Secondary | ICD-10-CM | POA: Insufficient documentation

## 2019-06-05 DIAGNOSIS — R109 Unspecified abdominal pain: Secondary | ICD-10-CM | POA: Diagnosis present

## 2019-06-05 DIAGNOSIS — R1084 Generalized abdominal pain: Secondary | ICD-10-CM | POA: Diagnosis not present

## 2019-06-05 DIAGNOSIS — R197 Diarrhea, unspecified: Secondary | ICD-10-CM | POA: Diagnosis not present

## 2019-06-05 DIAGNOSIS — R112 Nausea with vomiting, unspecified: Secondary | ICD-10-CM | POA: Diagnosis not present

## 2019-06-05 LAB — COMPREHENSIVE METABOLIC PANEL
ALT: 24 U/L (ref 0–44)
AST: 21 U/L (ref 15–41)
Albumin: 4.1 g/dL (ref 3.5–5.0)
Alkaline Phosphatase: 51 U/L (ref 38–126)
Anion gap: 9 (ref 5–15)
BUN: 14 mg/dL (ref 6–20)
CO2: 23 mmol/L (ref 22–32)
Calcium: 9 mg/dL (ref 8.9–10.3)
Chloride: 106 mmol/L (ref 98–111)
Creatinine, Ser: 0.73 mg/dL (ref 0.44–1.00)
GFR calc Af Amer: >60 mL/min (ref >60)
GFR calc non Af Amer: >60 mL/min (ref >60)
Glucose, Bld: 101 mg/dL — ABNORMAL HIGH (ref 70–99)
Potassium: 4.6 mmol/L (ref 3.5–5.1)
Sodium: 138 mmol/L (ref 135–145)
Total Bilirubin: 0.7 mg/dL (ref 0.3–1.2)
Total Protein: 7.2 g/dL (ref 6.5–8.1)

## 2019-06-05 LAB — URINALYSIS, ROUTINE W REFLEX MICROSCOPIC
Bacteria, UA: NONE SEEN
Bilirubin Urine: NEGATIVE
Glucose, UA: NEGATIVE mg/dL
Hgb urine dipstick: NEGATIVE
Ketones, ur: NEGATIVE mg/dL
Nitrite: NEGATIVE
Protein, ur: 30 mg/dL — AB
Specific Gravity, Urine: 1.032 — ABNORMAL HIGH (ref 1.005–1.030)
pH: 6 (ref 5.0–8.0)

## 2019-06-05 LAB — CBC
HCT: 42.5 % (ref 36.0–46.0)
Hemoglobin: 14.4 g/dL (ref 12.0–15.0)
MCH: 33.1 pg (ref 26.0–34.0)
MCHC: 33.9 g/dL (ref 30.0–36.0)
MCV: 97.7 fL (ref 80.0–100.0)
Platelets: 318 10*3/uL (ref 150–400)
RBC: 4.35 MIL/uL (ref 3.87–5.11)
RDW: 14.5 % (ref 11.5–15.5)
WBC: 14.1 10*3/uL — ABNORMAL HIGH (ref 4.0–10.5)
nRBC: 0 % (ref 0.0–0.2)

## 2019-06-05 LAB — LIPASE, BLOOD: Lipase: 22 U/L (ref 11–51)

## 2019-06-05 LAB — I-STAT BETA HCG BLOOD, ED (MC, WL, AP ONLY): I-stat hCG, quantitative: <5 m[IU]/mL (ref <5)

## 2019-06-05 MED ORDER — MORPHINE SULFATE (PF) 4 MG/ML IV SOLN
4.0000 mg | Freq: Once | INTRAVENOUS | Status: AC
  Administered 2019-06-05: 19:00:00 4 mg via INTRAVENOUS
  Filled 2019-06-05: qty 1

## 2019-06-05 MED ORDER — SODIUM CHLORIDE (PF) 0.9 % IJ SOLN
INTRAMUSCULAR | Status: AC
  Administered 2019-06-05: 17:00:00
  Filled 2019-06-05: qty 50

## 2019-06-05 MED ORDER — DICYCLOMINE HCL 20 MG PO TABS: 20.0000 mg | ORAL_TABLET | Freq: Two times a day (BID) | ORAL | 0 refills | Status: AC

## 2019-06-05 MED ORDER — MORPHINE SULFATE (PF) 4 MG/ML IV SOLN
4.0000 mg | Freq: Once | INTRAVENOUS | Status: AC
  Administered 2019-06-05: 16:00:00 4 mg via INTRAVENOUS
  Filled 2019-06-05: qty 1

## 2019-06-05 MED ORDER — ONDANSETRON HCL 4 MG/2ML IJ SOLN
4.0000 mg | Freq: Once | INTRAMUSCULAR | Status: AC
  Administered 2019-06-05: 4 mg via INTRAVENOUS
  Filled 2019-06-05: qty 2

## 2019-06-05 MED ORDER — ONDANSETRON 4 MG PO TBDP: 4.0000 mg | ORAL_TABLET | Freq: Three times a day (TID) | ORAL | 0 refills | Status: DC | PRN

## 2019-06-05 MED ORDER — ONDANSETRON HCL 4 MG/2ML IJ SOLN
4.0000 mg | Freq: Once | INTRAMUSCULAR | Status: AC
  Administered 2019-06-05: 16:00:00 4 mg via INTRAVENOUS
  Filled 2019-06-05: qty 2

## 2019-06-05 MED ORDER — IOHEXOL 300 MG/ML  SOLN
100.0000 mL | Freq: Once | INTRAMUSCULAR | Status: AC | PRN
  Administered 2019-06-05: 17:00:00 100 mL via INTRAVENOUS

## 2019-06-05 MED ORDER — ONDANSETRON 4 MG PO TBDP: 4.0000 mg | ORAL_TABLET | Freq: Three times a day (TID) | ORAL | 0 refills | Status: AC | PRN

## 2019-06-05 NOTE — ED Notes (Signed)
Pt let registration know that she is now vomiting. Registration made this triage RN aware.

## 2019-06-05 NOTE — ED Notes (Signed)
Patient transported to CT 

## 2019-06-05 NOTE — ED Provider Notes (Signed)
Care assumed from [facility], PA-C at shift change with CT abd/pelvis pending.   In brief, this patient is a 38 year old female who presents for evaluation of abdominal pain, diarrhea and vomiting that is been ongoing since eight 7/20.  She states she initially thought symptoms were due to onions that were recalled but states that it has continued to persist.  No blood in stools.  No sick contacts.  Please see note from previous provider for full history/physical exam.     Physical Exam  BP 127/75 (BP Location: Right Arm)   Pulse 63   Temp 98.7 F (37.1 C) (Oral)   Resp 18   Ht 5\' 9"  (1.753 m)   Wt 88.5 kg   SpO2 99%   BMI 28.80 kg/m   Physical Exam  Abdomen is soft, nondistended.  Mild tenderness noted to the epigastric region.  No lower abdominal tenderness.   ED Course/Procedures     Procedures  MDM  PLAN: Patient pending CT and pelvis.  If unremarkable, plan to dispel home.  MDM: UA with small leukocytes.  CMP is unremarkable.  Lipase is within normal limits.  CBC shows slight leukocytosis at 14.1.  Otherwise unremarkable.  CT scan shows mildly prominent loops of small bowel in left abdomen mild mesenteric stranding could represent enteritis.  I appendix is not currently identified but there is no surrounding signs of would be concerning for appendicitis.  Follicle noted in the right ovary which can indicate recent ovulation.  Reevaluation.  On my entrance into the exam room, patient is sitting in a chair talking on the phone and no signs of distress.  She does report that her pain had improved though she feels like whenever the pain medication is wearing off, is returning.  On repeat abdominal exam, her abdomen is soft, no rigidity, guarding.  No sign concerns for peritonitis.  She has some mild tenderness noted to epigastric region but no lower abdominal tenderness.  At this time, her exam not concerning for appendicitis, ovarian torsion, ovarian etiology.  I discussed  with her I suspect her symptoms are viral in nature.  At this time, she is tolerating p.o., work-up is unremarkable.  Vitals are stable.  I feel that she can be managed outpatient.  Will give Zofran and Bentyl for symptomatic relief.  I did discuss with patient that her appendix is not seen though I do not see any signs of surrounding inflammation that be concerning for appendicitis and additionally, she has no right lower quadrant abdominal tenderness.  I did discuss with her regarding closely monitoring her symptoms and returning to the emergency department immediately if they are worsened. Patient comfortable with plan. At this time, patient exhibits no emergent life-threatening condition that require further evaluation in ED or admission. Patient had ample opportunity for questions and discussion. All patient's questions were answered with full understanding. Strict return precautions discussed. Patient expresses understanding and agreement to plan.    Plan to follow up with PCP as needed and return precautions discussed for worsening or new concerning symptoms.    1. Nausea vomiting and diarrhea      Portions of this note were generated with Dragon dictation software. Dictation errors may occur despite best attempts at proofreading.    Volanda Napoleon, PA-C 06/06/19 9628    Gareth Morgan, MD 06/06/19 1622

## 2019-06-05 NOTE — ED Provider Notes (Signed)
[facility]-EMERGENCY DEPT Provider Note   CSN: [id number] Arrival date & time: 06/05/19  [id number]    History   Chief Complaint Chief Complaint  Patient presents with  . Abdominal Pain    HPI [name] is a 38 y.o. female with a past medical history of tobacco abuse presents to ED for abdominal pain, diarrhea and vomiting.  States that she has been having diarrhea since 05/31/2019.  She initially thought it was due to onions that she ate which were now recalled.  She began having persistent abdominal pain over the weekend describes as sharp, not relieved with bowel movements.  She began vomiting earlier today.  Denies any blood in her stool her emesis.  No sick contacts with similar symptoms although she did have a friend who ate the same meal as her but did not eat onions.  She denies history of similar symptoms in the past.  She has not had any over-the-counter medications to help with her symptoms.  Denies prior abdominal surgeries.  Denies any alcohol or other drug use, chronic NSAID use, urinary symptoms, vaginal complaints, fever, shortness of breath or back pain.     HPI  Past Medical History:  Diagnosis Date  . Asthma   . H/O seasonal allergies     Patient Active Problem List   Diagnosis Date Noted  . Allergic rhinitis due to pollen 01/26/2016  . Tobacco abuse 01/26/2016  . Routine general medical examination at a health care facility 01/26/2016  . Bipolar I disorder (HCC) 01/26/2016    History reviewed. No pertinent surgical history.   OB History   No obstetric history on file.      Home Medications    Prior to Admission medications   Medication Sig Start Date End Date Taking? Authorizing Provider  albuterol (PROVENTIL HFA;VENTOLIN HFA) 108 (90 Base) MCG/ACT inhaler Inhale 2 puffs into the lungs every 6 (six) hours as needed for wheezing or shortness of breath. Patient not taking: Reported on 04/05/2019 01/26/16   Ronnald NianLalonde, John C, MD   HYDROcodone-acetaminophen (NORCO/VICODIN) 5-325 MG tablet Take 2 tablets by mouth every 6 (six) hours as needed. 04/05/19   Raeford RazorKohut, Stephen, MD  naproxen (NAPROSYN) 375 MG tablet Take 1 tablet (375 mg total) by mouth 2 (two) times daily. 09/26/16   Palumbo, April, MD  ondansetron (ZOFRAN ODT) 4 MG disintegrating tablet Take 1 tablet (4 mg total) by mouth every 8 (eight) hours as needed for nausea or vomiting. 06/05/19   Dietrich PatesKhatri, Sophiya Morello, PA-C    Family History Family History  Adopted: Yes    Social History Social History   Tobacco Use  . Smoking status: Current Every Day Smoker    Packs/day: 1.00    Types: Cigarettes  . Smokeless tobacco: Never Used  Substance Use Topics  . Alcohol use: No    Alcohol/week: 0.0 standard drinks  . Drug use: Yes    Types: Marijuana    Comment: weed     Allergies   Patient has no known allergies.   Review of Systems Review of Systems  Constitutional: Negative for appetite change, chills and fever.  HENT: Negative for ear pain, rhinorrhea, sneezing and sore throat.   Eyes: Negative for photophobia and visual disturbance.  Respiratory: Negative for cough, chest tightness, shortness of breath and wheezing.   Cardiovascular: Negative for chest pain and palpitations.  Gastrointestinal: Positive for abdominal pain, diarrhea, nausea and vomiting. Negative for blood in stool and constipation.  Genitourinary: Negative for dysuria, hematuria  and urgency.  Musculoskeletal: Negative for myalgias.  Skin: Negative for rash.  Neurological: Negative for dizziness, weakness and light-headedness.     Physical Exam Updated Vital Signs BP 127/75 (BP Location: Right Arm)   Pulse 63   Temp 98.7 F (37.1 C) (Oral)   Resp 18   Ht 5\' 9"  (1.753 m)   Wt 88.5 kg   SpO2 99%   BMI 28.80 kg/m   Physical Exam Vitals signs and nursing note reviewed.  Constitutional:      General: She is not in acute distress.    Appearance: She is well-developed.  HENT:      Head: Normocephalic and atraumatic.     Nose: Nose normal.  Eyes:     General: No scleral icterus.       Left eye: No discharge.     Conjunctiva/sclera: Conjunctivae normal.  Neck:     Musculoskeletal: Normal range of motion and neck supple.  Cardiovascular:     Rate and Rhythm: Normal rate and regular rhythm.     Heart sounds: Normal heart sounds. No murmur. No friction rub. No gallop.   Pulmonary:     Effort: Pulmonary effort is normal. No respiratory distress.     Breath sounds: Normal breath sounds.  Abdominal:     General: Bowel sounds are normal. There is no distension.     Palpations: Abdomen is soft.     Tenderness: There is generalized abdominal tenderness. There is no guarding.  Musculoskeletal: Normal range of motion.  Skin:    General: Skin is warm and dry.     Findings: No rash.  Neurological:     Mental Status: She is alert.     Motor: No abnormal muscle tone.     Coordination: Coordination normal.      ED Treatments / Results  Labs (all labs ordered are listed, but only abnormal results are displayed) Labs Reviewed  COMPREHENSIVE METABOLIC PANEL - Abnormal; Notable for the following components:      Result Value   Glucose, Bld 101 (*)    All other components within normal limits  CBC - Abnormal; Notable for the following components:   WBC 14.1 (*)    All other components within normal limits  URINALYSIS, ROUTINE W REFLEX MICROSCOPIC - Abnormal; Notable for the following components:   APPearance HAZY (*)    Specific Gravity, Urine 1.032 (*)    Protein, ur 30 (*)    Leukocytes,Ua SMALL (*)    All other components within normal limits  LIPASE, BLOOD  I-STAT BETA HCG BLOOD, ED ([facility], [facility], [facility] ONLY)    EKG None  Radiology No results found.  Procedures Procedures (including critical care time)  Medications Ordered in ED Medications  morphine 4 MG/ML injection 4 mg (4 mg Intravenous Given 06/05/19 1604)  ondansetron (ZOFRAN) injection 4 mg (4 mg  Intravenous Given 06/05/19 1604)  sodium chloride (PF) 0.9 % injection (  Given by Other 06/05/19 1630)  iohexol (OMNIPAQUE) 300 MG/ML solution 100 mL (100 mLs Intravenous Contrast Given 06/05/19 1644)  sodium chloride (PF) 0.9 % injection (  Contrast Given 06/05/19 1652)     Initial Impression / Assessment and Plan / ED Course  I have reviewed the triage vital signs and the nursing notes.  Pertinent labs & imaging results that were available during my care of the patient were reviewed by me and considered in my medical decision making (see chart for details).        38 year old female  presents to ED for generalized abdominal pain, diarrhea and vomiting.  Began having the abdominal pain and diarrhea for the past 5 days.  She initially thought it was due to a meal that she ate.  Denies any hematemesis, hematochezia or melena.  Friend ate a same meal as she did but did not eat the onions, which patient believes is the cause of her symptoms.  States that the pain has been constant since it began.  No history of similar symptoms in the past.  On exam abdomen is generally tender without rebound or guarding.  Her vital signs are reassuring.  Lab work significant for leukocytosis of 14, urinalysis with small leukocytes, lipase unremarkable.  CMP unremarkable.  hCG is negative.  Due to her ongoing pain and duration of symptoms, will obtain CT of abdomen pelvis to rule out specific cause of her symptoms. Care handed off to oncoming provider pending CT.   Final Clinical Impressions(s) / ED Diagnoses   Final diagnoses:  Nausea vomiting and diarrhea    ED Discharge Orders         Ordered    ondansetron (ZOFRAN ODT) 4 MG disintegrating tablet  Every 8 hours PRN     06/05/19 1653           Dietrich PatesKhatri, Juley Giovanetti, PA-C 06/05/19 1654    Alvira MondaySchlossman, Erin, MD 06/06/19 1612

## 2019-06-05 NOTE — ED Notes (Signed)
Unsuccessful IV attempt x2.  

## 2019-06-05 NOTE — Discharge Instructions (Addendum)
Take Zofran for nausea and vomiting.  Take Bentyl for pain.  Make sure you are staying hydrated drink plenty of fluids.  As we discussed, closely monitor symptoms.  Return the emergency department for any worsening abdominal pain, vertically in the right lower side, persistent vomiting, fevers or any other worsening or concerning symptoms.

## 2019-06-05 NOTE — ED Triage Notes (Signed)
Pt c/o abd pains that started at 7am today. Pt had diarrhea since Thursday.

## 2019-10-31 IMAGING — CT CT ABDOMEN AND PELVIS WITH CONTRAST
2 of 5 series · 15 of 46 positions shown, 17 images · IV contrast (APPLIED)
Comparison: None.

CLINICAL DATA: Pt c/o abd pains that started at 7am today. Pt had
diarrhea since [REDACTED]. Per patient "she upper abdominal pain
towards the right side"^100mL OMNIPAQUE IOHEXOL 300 MG/ML SOLNAbd
pain, acute, generalized

EXAM:
CT ABDOMEN AND PELVIS WITH CONTRAST
TECHNIQUE: Multidetector CT imaging of the abdomen and pelvis was performed
using the standard protocol following bolus administration of
intravenous contrast.
CONTRAST:  100mL OMNIPAQUE IOHEXOL 300 MG/ML  SOLN

[Series 2: axial st · axial · 0.66mm/px · z∈[+809,+1174]mm · 12 of 87 slices shown, 14 images]
[im 7/87  soft-tissue]
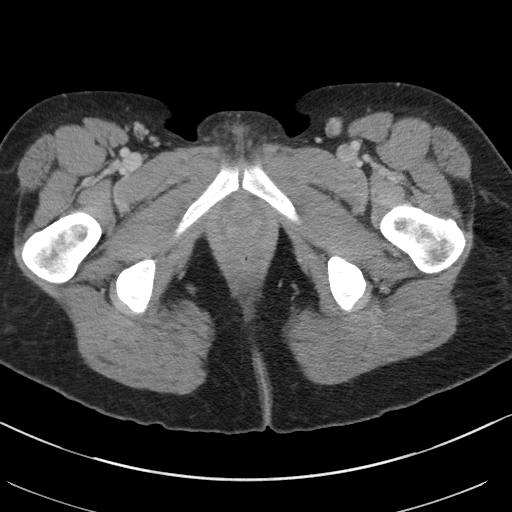
[im 7/87  bone]
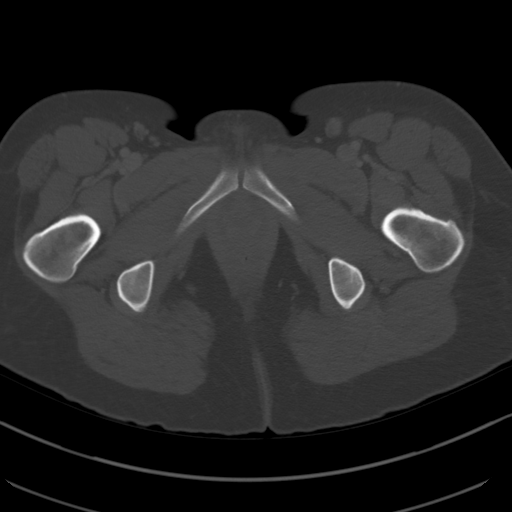
[im 13/87  soft-tissue]
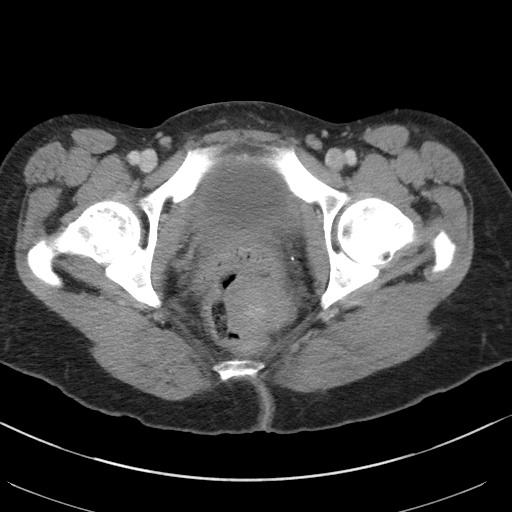
[im 19/87  soft-tissue]
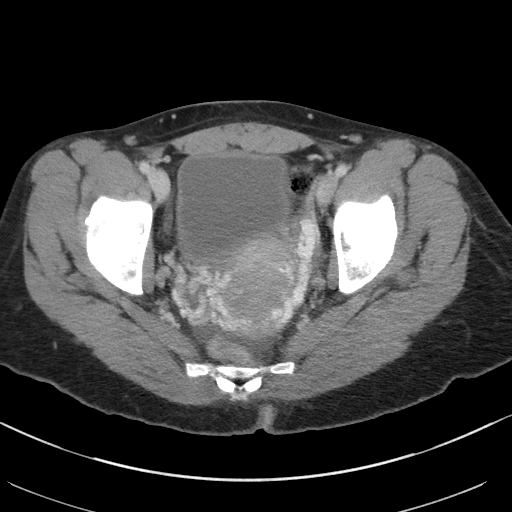
[im 25/87  soft-tissue]
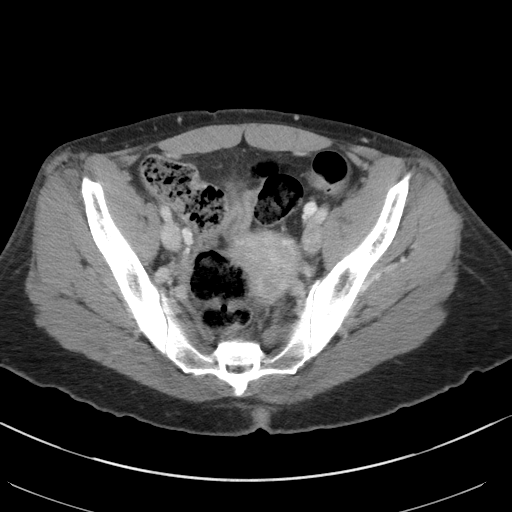
[im 31/87  soft-tissue]
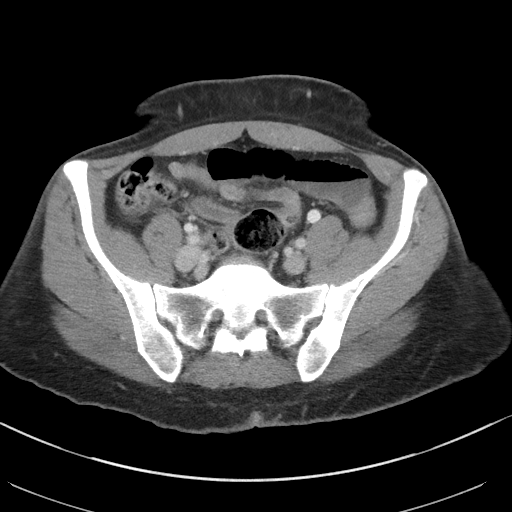
[im 37/87  soft-tissue]
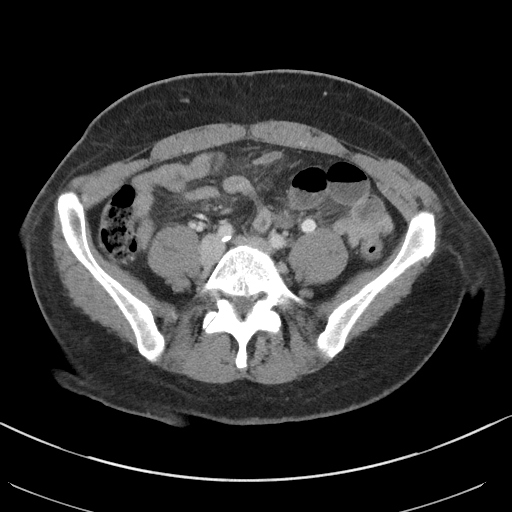
[im 50/87  soft-tissue]
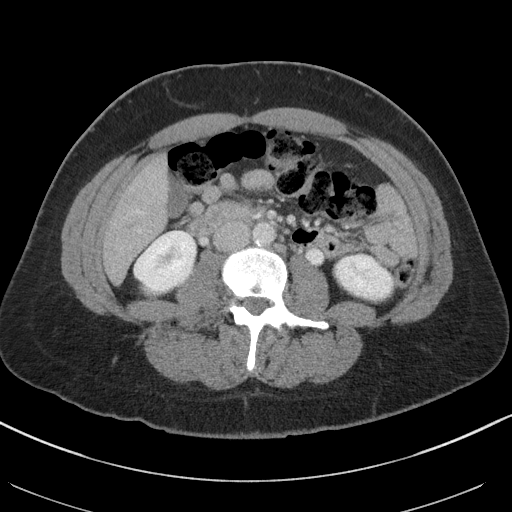
[im 56/87  soft-tissue]
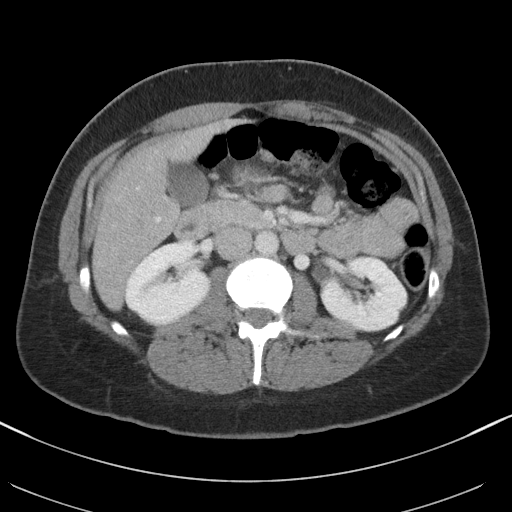
[im 62/87  soft-tissue]
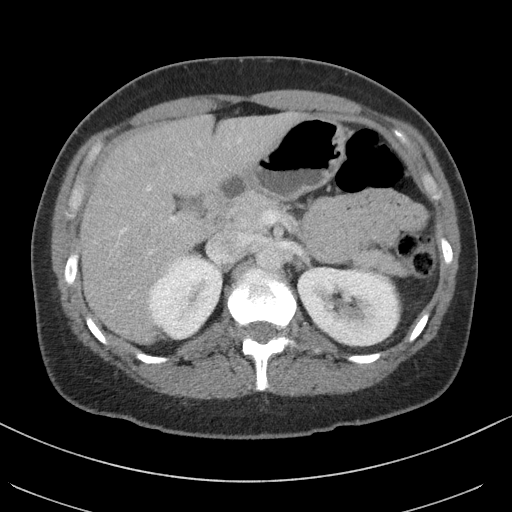
[im 62/87  bone]
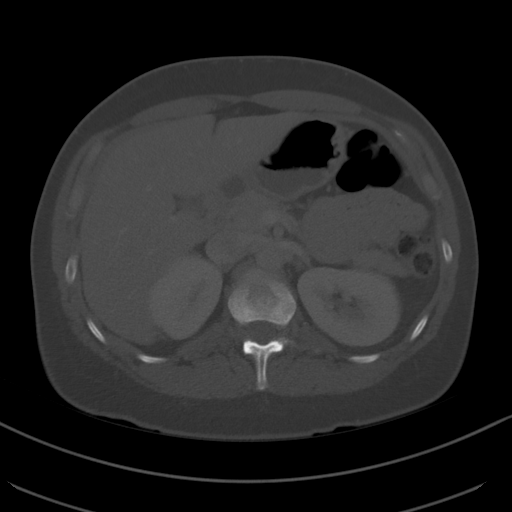
[im 68/87  soft-tissue]
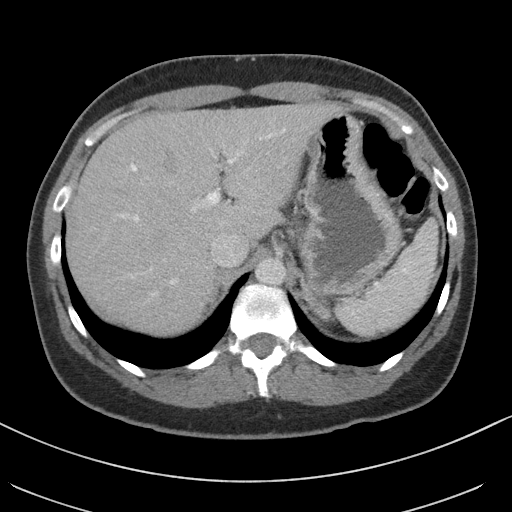
[im 74/87  soft-tissue]
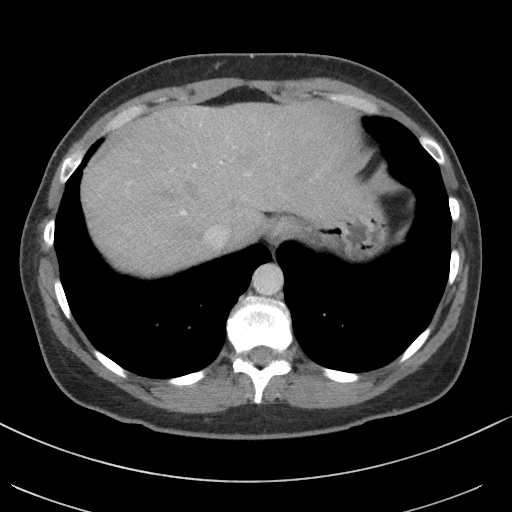
[im 80/87  soft-tissue]
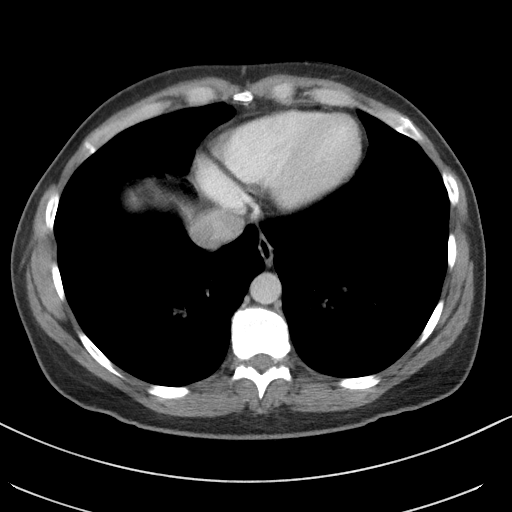

[Series 5: coronal st · coronal · 0.64mm/px · 3 of 77 slices shown]
[im 26/77  soft-tissue]
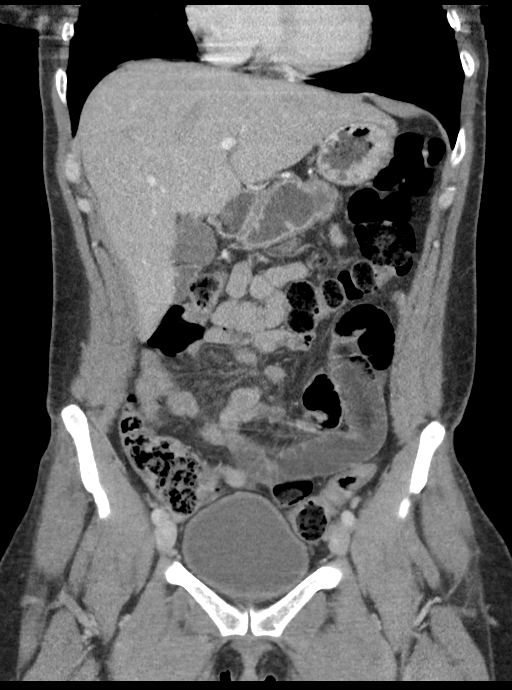
[im 34/77  soft-tissue]
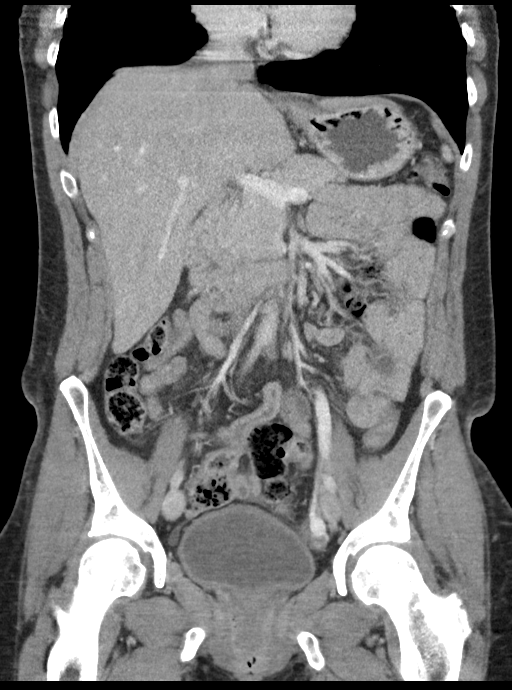
[im 43/77  soft-tissue]
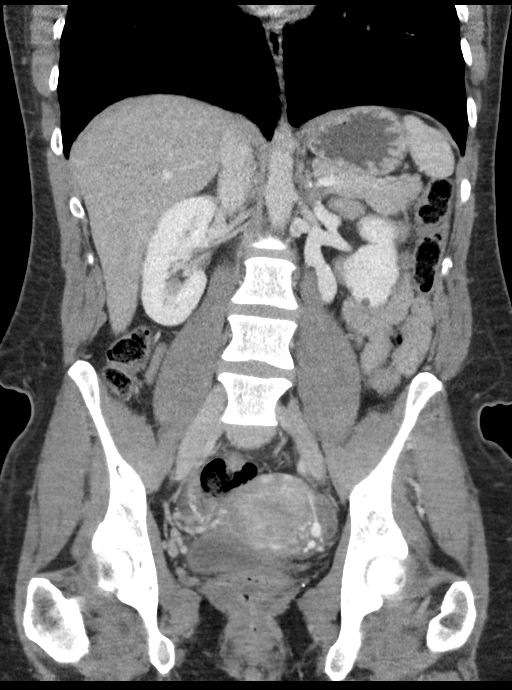

[15 of 46 positions shown; findings below may reference images not displayed]

FINDINGS: Lower chest: Lung bases are clear.

Hepatobiliary: No focal hepatic lesion. No biliary duct dilatation.
Gallbladder is normal. Common bile duct is normal.

Pancreas: No pancreatic inflammation.  No duct dilatation.

Spleen: Normal spleen

Adrenals/urinary tract: Adrenal glands and kidneys are normal. The
ureters and bladder normal.

Stomach/Bowel: Stomach and duodenum are normal. There is prompt
loops small bowel in the LEFT abdomen which are gas and fluid filled
and upper limits of normal at 2.5 cm (coronal image [DATE]). There is
mild haziness central mesentery (image 48/2).

Terminal ileum appears normal. The appendix is not identified. No
clear periappendiceal inflammation. Ascending and transverse colon
normal. The descending colon and rectosigmoid colon are normal.

Vascular/Lymphatic: Abdominal aorta is normal caliber. No periportal
or retroperitoneal adenopathy. No pelvic adenopathy.

Reproductive: Uterus normal. There is prominent parametrial veins.
There is enhancing follicle in the RIGHT ovary which measures 1.2 cm
within the normal 3.5 cm ovary. LEFT ovary normal

A small amount free fluid in the RIGHT pelvis.

Other: Small free fluid in the RIGHT pelvis.

Musculoskeletal: No aggressive osseous lesion.
IMPRESSION: 1. Mildly prominent loops of small bowel in the LEFT abdomen with
mild mesenteric stranding. Findings could represent enteritis.
2. Appendix is not confidently identified. No clear evidence of
acute appendicitis. If patient has persistent signs or symptoms of
appendicitis recommend repeat scan in the short-term.
3. Small amount free fluid in the RIGHT pelvis is likely
physiologic. There is enhancing follicle in the RIGHT ovary which
could indicate recent ovulation.

## 2021-11-04 ENCOUNTER — Telehealth: Payer: Self-pay

## 2021-11-04 NOTE — Telephone Encounter (Signed)
Lvm for pt to call back to advise if she is still a pt. [place]

## 2021-11-10 ENCOUNTER — Telehealth: Payer: Self-pay

## 2021-11-10 NOTE — Telephone Encounter (Signed)
Called pt to find out if she is still ours and if she has an Ob/gyn doctor to get pap and mammo results from. [facility]

## 2022-02-15 ENCOUNTER — Encounter: Payer: BLUE CROSS/BLUE SHIELD | Admitting: Family Medicine

## 2022-02-18 ENCOUNTER — Encounter: Payer: Self-pay | Admitting: Family Medicine
